# Patient Record
Sex: Male | Born: 1975 | Hispanic: Yes | Smoking: Never smoker
Health system: Southern US, Community
[De-identification: ages and names within clinical notes are randomized; demographics above are authoritative.]

---

## 2014-11-29 ENCOUNTER — Emergency Department: Payer: Self-pay | Admitting: Student

## 2020-09-09 ENCOUNTER — Emergency Department: Payer: Self-pay

## 2020-09-09 ENCOUNTER — Other Ambulatory Visit: Payer: Self-pay

## 2020-09-09 ENCOUNTER — Encounter: Payer: Self-pay | Admitting: Emergency Medicine

## 2020-09-09 ENCOUNTER — Emergency Department
Admission: EM | Admit: 2020-09-09 | Discharge: 2020-09-09 | Disposition: A | Discharge status: Home / Self Care | Payer: Self-pay | Attending: Emergency Medicine | Admitting: Emergency Medicine

## 2020-09-09 DIAGNOSIS — S0083XA Contusion of other part of head, initial encounter: Secondary | ICD-10-CM | POA: Insufficient documentation

## 2020-09-09 DIAGNOSIS — S8001XA Contusion of right knee, initial encounter: Secondary | ICD-10-CM | POA: Insufficient documentation

## 2020-09-09 DIAGNOSIS — S022XXA Fracture of nasal bones, initial encounter for closed fracture: Secondary | ICD-10-CM | POA: Insufficient documentation

## 2020-09-09 DIAGNOSIS — T148XXA Other injury of unspecified body region, initial encounter: Secondary | ICD-10-CM

## 2020-09-09 DIAGNOSIS — S0081XA Abrasion of other part of head, initial encounter: Secondary | ICD-10-CM | POA: Insufficient documentation
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
## 2020-09-09 NOTE — Discharge Instructions (Addendum)
Follow-up [facility] if any problems with your eye.   ENT if any problems breathing through your nose after the swelling has gone down.  Use ice packs to your face to help reduce swelling.  Tylenol if needed for pain.  You may also apply ice packs to your knee.
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
